# Patient Record
Sex: Female | Born: 1940 | Race: White | Hispanic: No | Marital: Single | Smoking: Never smoker
Health system: Southern US, Community
[De-identification: ages and names within clinical notes are randomized; demographics above are authoritative.]

## PROBLEM LIST (undated history)

## (undated) DIAGNOSIS — F329 Major depressive disorder, single episode, unspecified: Secondary | ICD-10-CM

## (undated) DIAGNOSIS — F32A Depression, unspecified: Secondary | ICD-10-CM

## (undated) HISTORY — PX: TUBAL LIGATION: SHX77

## (undated) HISTORY — PX: CHOLECYSTECTOMY: SHX55

---

## 2017-02-01 ENCOUNTER — Other Ambulatory Visit
Admission: RE | Admit: 2017-02-01 | Discharge: 2017-02-01 | Disposition: A | Discharge status: Home / Self Care | Payer: Non-veteran care | Source: Ambulatory Visit | Attending: Gastroenterology | Admitting: Gastroenterology

## 2017-02-01 ENCOUNTER — Encounter (INDEPENDENT_AMBULATORY_CARE_PROVIDER_SITE_OTHER): Payer: Self-pay

## 2017-02-01 ENCOUNTER — Encounter: Payer: Self-pay | Admitting: Gastroenterology

## 2017-02-01 ENCOUNTER — Ambulatory Visit (INDEPENDENT_AMBULATORY_CARE_PROVIDER_SITE_OTHER): Payer: No Typology Code available for payment source | Admitting: Gastroenterology

## 2017-02-01 ENCOUNTER — Other Ambulatory Visit: Payer: Self-pay

## 2017-02-01 VITALS — BP 135/77 | HR 87 | Temp 98.2°F | Ht 65.0 in | Wt 120.8 lb

## 2017-02-01 DIAGNOSIS — R634 Abnormal weight loss: Secondary | ICD-10-CM | POA: Diagnosis not present

## 2017-02-01 DIAGNOSIS — R194 Change in bowel habit: Secondary | ICD-10-CM | POA: Diagnosis not present

## 2017-02-01 DIAGNOSIS — R1010 Upper abdominal pain, unspecified: Secondary | ICD-10-CM

## 2017-02-01 DIAGNOSIS — R1084 Generalized abdominal pain: Secondary | ICD-10-CM

## 2017-02-01 DIAGNOSIS — R109 Unspecified abdominal pain: Secondary | ICD-10-CM | POA: Insufficient documentation

## 2017-02-01 LAB — COMPREHENSIVE METABOLIC PANEL
ALT: 21 U/L (ref 14–54)
AST: 28 U/L (ref 15–41)
Albumin: 4.6 g/dL (ref 3.5–5.0)
Alkaline Phosphatase: 52 U/L (ref 38–126)
Anion gap: 8 (ref 5–15)
BUN: 10 mg/dL (ref 6–20)
CHLORIDE: 102 mmol/L (ref 101–111)
CO2: 30 mmol/L (ref 22–32)
CREATININE: 0.86 mg/dL (ref 0.44–1.00)
Calcium: 9.7 mg/dL (ref 8.9–10.3)
Glucose, Bld: 109 mg/dL — ABNORMAL HIGH (ref 65–99)
Potassium: 4.4 mmol/L (ref 3.5–5.1)
Sodium: 140 mmol/L (ref 135–145)
TOTAL PROTEIN: 7.6 g/dL (ref 6.5–8.1)
Total Bilirubin: 0.4 mg/dL (ref 0.3–1.2)

## 2017-02-01 LAB — CBC WITH DIFFERENTIAL/PLATELET
Basophils Absolute: 0 10*3/uL (ref 0–0.1)
Basophils Relative: 0 %
EOS PCT: 1 %
Eosinophils Absolute: 0.1 10*3/uL (ref 0–0.7)
HCT: 37.9 % (ref 35.0–47.0)
Hemoglobin: 12.5 g/dL (ref 12.0–16.0)
LYMPHS ABS: 1.5 10*3/uL (ref 1.0–3.6)
LYMPHS PCT: 25 %
MCH: 30.8 pg (ref 26.0–34.0)
MCHC: 33 g/dL (ref 32.0–36.0)
MCV: 93.3 fL (ref 80.0–100.0)
MONO ABS: 0.3 10*3/uL (ref 0.2–0.9)
Monocytes Relative: 5 %
Neutro Abs: 4.2 10*3/uL (ref 1.4–6.5)
Neutrophils Relative %: 69 %
PLATELETS: 276 10*3/uL (ref 150–440)
RBC: 4.06 MIL/uL (ref 3.80–5.20)
RDW: 13.2 % (ref 11.5–14.5)
WBC: 6.1 10*3/uL (ref 3.6–11.0)

## 2017-02-01 LAB — TSH: TSH: 0.864 u[IU]/mL (ref 0.350–4.500)

## 2017-02-01 MED ORDER — PEG 3350-KCL-NA BICARB-NACL 420 G PO SOLR: 4000.0000 mL | Freq: Once | ORAL | 0 refills | Status: AC

## 2017-02-01 MED ORDER — OMEPRAZOLE 40 MG PO CPDR: 40.0000 mg | DELAYED_RELEASE_CAPSULE | Freq: Every day | ORAL | 3 refills | Status: DC

## 2017-02-01 NOTE — Progress Notes (Signed)
Wyline MoodKiran Lizzie Cokley MD, MRCP(U.K) [address]  [address]  [place], [place] [address]  Main: [phone]  Fax: [phone]   Gastroenterology Consultation  Referring Provider:     [facility], [facility] Primary Care Physician:  [facility], [facility] Primary Gastroenterologist:  Dr. Wyline MoodKiran Dimitris Shanahan  Reason for Consultation:     ibs-mixed         HPI:   Jasmine BarreSandra Lee Isenhower is a 76 y.o. y/o female referred for consultation & management  by Dr. Eli Phillipsenter, [facility].    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    She says that in August she went to a picnic , came home ,was violently sick, thought she had food poisoning. Stopped throwing up and the diarrhea resolved. She subsequently developed constipation followed by diarrhea. She says she has some pain all over her abdomen all day long , worse after she eats, no better after a bowel movement , describes the pain as a squeezing in nature. Eating makes it worse. Made better by "nothing ".   She takes ibuprofen but stopped in august. She has a bowel movement twice a day , soft , consistency of pudding, she did have gas when she was taking probiotics. Since she stopped has no gas, She consumes stevia 3 teaspoons a day - no change in recently . Consumes 8 oz of diet soda a day . Occasionally sweetened tea once a week , no issues with gas in general. No blood in stool. She has lost 5 lbs since august . Never smoked. Her aunt had colon cancer. Her last colonoscopy 1 year back at the [facility] and was normal , last EGD 1 year back which she recalls was normal too.   Her biggest issues at this time is change in bowel habits and abdominal discomfort.   Not on any PPI.  No past medical history on file.    Prior to Admission medications   Not on File    No family history on file.   Social History   Tobacco Use  . Smoking status: Not on file  Substance Use Topics  . Alcohol use: Not on file  . Drug use: Not on file    Allergies  as of 02/01/2017  . (Not on File)    Review of Systems:    All systems reviewed and negative except where noted in HPI.   Physical Exam:  There were no vitals taken for this visit. No LMP recorded. Psych:  Alert and cooperative. Normal mood and affect. General:   Alert,  Well-developed,thim pleasant and cooperative in NAD Head:  Normocephalic and atraumatic. Eyes:  Sclera clear, no icterus.   Conjunctiva pink. Ears:  Normal auditory acuity. Nose:  No deformity, discharge, or lesions. Mouth:  No deformity or lesions,oropharynx pink & moist. Neck:  Supple; no masses or thyromegaly. Lungs:  Respirations even and unlabored.  Clear throughout to auscultation.   No wheezes, crackles, or rhonchi. No acute distress. Heart:  Regular rate and rhythm; no murmurs, clicks, rubs, or gallops. Abdomen:  Normal bowel sounds.  No bruits.  Soft,mild epigastric tenderness and non-distended without masses, hepatosplenomegaly or hernias noted.  No guarding or rebound tenderness.    Msk:  Symmetrical without gross deformities. Good, equal movement & strength bilaterally. Pulses:  Normal pulses noted. Extremities:  No clubbing or edema.  No cyanosis.tenderness in lower left sacral paraspinal muscles.  Neurologic:  Alert and oriented x3;  grossly normal neurologically. Skin:  Intact without significant lesions or rashes. No jaundice. Lymph Nodes:  No significant cervical adenopathy. Psych:  Alert and cooperative. Normal mood and affect.  Imaging Studies: No results found.  Assessment and Plan:   Jasmine Ibarra is a 76 y.o. y/o female has been referred for change in bowel habits, new onset abdominal pain , weight loss. She feels she is in significant discomfort, her symptoms do not suggest IBS as she has no relief of pain after a bowel movement. At her age , it would be important to r/o neoplasia. Other differentials would be a visceral hypersenitivity syndrome after a viral illness.    Plan  1. Stool  H pylori antigen ,CBC,CMP,CRP,TSH,celiac serology.  2. Ct abdomen and pelvis 3. EGD+colonoscopy to evaluate abdominal pain and change in bowel habits 4. Prilosec 40 mg once a day  5. LOW FODMAP diet 6. F/u with PCP for back pain.   7. Advised to stop all artificial sweeteners including stevia.   I have discussed alternative options, risks & benefits,  which include, but are not limited to, bleeding, infection, perforation,respiratory complication & drug reaction.  The patient agrees with this plan & written consent will be obtained.    Follow up in 2-3 weeks  Dr Wyline MoodKiran Kinslie Hove MD,MRCP(U.K)

## 2017-02-02 ENCOUNTER — Telehealth: Payer: Self-pay

## 2017-02-02 LAB — C-REACTIVE PROTEIN

## 2017-02-02 NOTE — Telephone Encounter (Signed)
Advised patient of CT Abd/Pelvis.  Dec 31st @ 800am. NPO 4 hours  Advised patient to pick-up contrast when she takes stool sample back to the lab.

## 2017-02-03 ENCOUNTER — Telehealth: Payer: Self-pay | Admitting: Gastroenterology

## 2017-02-03 LAB — CELIAC DISEASE PANEL
ENDOMYSIAL ANTIBODY IGA: NEGATIVE
IgA: 113 mg/dL (ref 64–422)

## 2017-02-03 NOTE — Telephone Encounter (Signed)
Patient left a voice message that she has questions regarding her procedure. Please call

## 2017-02-03 NOTE — Telephone Encounter (Signed)
Patient has VA insurance and at this time has 2 visits authorized. She stated she has a CT scan appt on 12/31. Did someone send for more authorization?   Please call patient and let her know what is going on

## 2017-02-04 ENCOUNTER — Ambulatory Visit: Payer: Non-veteran care | Admitting: Anesthesiology

## 2017-02-04 ENCOUNTER — Encounter
Admission: RE | Disposition: A | Discharge status: Home / Self Care | Payer: Self-pay | Source: Ambulatory Visit | Attending: Gastroenterology

## 2017-02-04 ENCOUNTER — Ambulatory Visit
Admission: RE | Admit: 2017-02-04 | Discharge: 2017-02-04 | Disposition: A | Discharge status: Home / Self Care | Payer: Non-veteran care | Source: Ambulatory Visit | Attending: Gastroenterology | Admitting: Gastroenterology

## 2017-02-04 ENCOUNTER — Encounter: Payer: Self-pay | Admitting: *Deleted

## 2017-02-04 DIAGNOSIS — K219 Gastro-esophageal reflux disease without esophagitis: Secondary | ICD-10-CM | POA: Diagnosis not present

## 2017-02-04 DIAGNOSIS — F329 Major depressive disorder, single episode, unspecified: Secondary | ICD-10-CM | POA: Diagnosis not present

## 2017-02-04 DIAGNOSIS — K2970 Gastritis, unspecified, without bleeding: Secondary | ICD-10-CM | POA: Diagnosis not present

## 2017-02-04 DIAGNOSIS — E039 Hypothyroidism, unspecified: Secondary | ICD-10-CM | POA: Insufficient documentation

## 2017-02-04 DIAGNOSIS — R109 Unspecified abdominal pain: Secondary | ICD-10-CM | POA: Diagnosis not present

## 2017-02-04 DIAGNOSIS — Z888 Allergy status to other drugs, medicaments and biological substances status: Secondary | ICD-10-CM | POA: Diagnosis not present

## 2017-02-04 DIAGNOSIS — Z800 Family history of malignant neoplasm of digestive organs: Secondary | ICD-10-CM | POA: Insufficient documentation

## 2017-02-04 DIAGNOSIS — R1084 Generalized abdominal pain: Secondary | ICD-10-CM | POA: Insufficient documentation

## 2017-02-04 DIAGNOSIS — Z79899 Other long term (current) drug therapy: Secondary | ICD-10-CM | POA: Diagnosis not present

## 2017-02-04 DIAGNOSIS — R1010 Upper abdominal pain, unspecified: Secondary | ICD-10-CM

## 2017-02-04 DIAGNOSIS — Z882 Allergy status to sulfonamides status: Secondary | ICD-10-CM | POA: Diagnosis not present

## 2017-02-04 DIAGNOSIS — R194 Change in bowel habit: Secondary | ICD-10-CM

## 2017-02-04 DIAGNOSIS — R634 Abnormal weight loss: Secondary | ICD-10-CM

## 2017-02-04 HISTORY — PX: COLONOSCOPY WITH PROPOFOL: SHX5780

## 2017-02-04 HISTORY — DX: Depression, unspecified: F32.A

## 2017-02-04 HISTORY — DX: Major depressive disorder, single episode, unspecified: F32.9

## 2017-02-04 HISTORY — PX: ESOPHAGOGASTRODUODENOSCOPY (EGD) WITH PROPOFOL: SHX5813

## 2017-02-04 LAB — H. PYLORI ANTIGEN, STOOL: H. Pylori Stool Ag, Eia: NEGATIVE

## 2017-02-04 SURGERY — ESOPHAGOGASTRODUODENOSCOPY (EGD) WITH PROPOFOL
Anesthesia: General

## 2017-02-04 MED ORDER — PROPOFOL 10 MG/ML IV BOLUS
INTRAVENOUS | Status: DC | PRN
Start: 2017-02-04 — End: 2017-02-04
  Administered 2017-02-04: 20 mg via INTRAVENOUS
  Administered 2017-02-04: 50 mg via INTRAVENOUS
  Administered 2017-02-04: 30 mg via INTRAVENOUS

## 2017-02-04 MED ORDER — FENTANYL CITRATE (PF) 100 MCG/2ML IJ SOLN: 25.0000 ug | INTRAMUSCULAR | Status: DC | PRN

## 2017-02-04 MED ORDER — SODIUM CHLORIDE 0.9 % IV SOLN
INTRAVENOUS | Status: DC
  Administered 2017-02-04: 09:00:00 via INTRAVENOUS

## 2017-02-04 MED ORDER — PROPOFOL 500 MG/50ML IV EMUL
INTRAVENOUS | Status: DC | PRN
  Administered 2017-02-04: 150 ug/kg/min via INTRAVENOUS

## 2017-02-04 MED ORDER — PROPOFOL 500 MG/50ML IV EMUL
INTRAVENOUS | Status: AC
  Filled 2017-02-04: qty 50

## 2017-02-04 MED ORDER — ONDANSETRON HCL 4 MG/2ML IJ SOLN
4.0000 mg | Freq: Once | INTRAMUSCULAR | Status: DC | PRN
Start: 2017-02-04 — End: 2017-02-04

## 2017-02-04 NOTE — H&P (Signed)
Jasmine MoodKiran Daya Dutt, MD [address], [address], [place], [place], [address] [address], [address], [place], [place], [address] Phone: [phone]  Fax: [phone]  Primary Care Physician:  [facility]   Pre-Procedure History & Physical: HPI:  Jasmine Ibarra is a 76 y.o. female is here for an endoscopy and colonoscopy    Past Medical History:  Diagnosis Date  . Depression     Past Surgical History:  Procedure Laterality Date  . CHOLECYSTECTOMY    . TUBAL LIGATION      Prior to Admission medications   Medication Sig Start Date End Date Taking? Authorizing Provider  buPROPion (WELLBUTRIN) 100 MG tablet Take 100 mg by mouth.   Yes [provider]  Cholecalciferol (D3-1000 PO) Take 1 tablet by mouth.   Yes [provider]  levothyroxine (SYNTHROID, LEVOTHROID) 100 MCG tablet Take by mouth.   Yes [provider]  MELATONIN ER PO Take 1 tablet by mouth.   Yes [provider]  Multiple Vitamin (MULTIVITAMIN) tablet Take 1 tablet by mouth daily.   Yes [provider]  omeprazole (PRILOSEC) 40 MG capsule Take 1 capsule (40 mg total) by mouth daily. 02/01/17  Yes Jasmine MoodAnna, Virgle Arth, MD  traZODone (DESYREL) 100 MG tablet Take 100 mg by mouth.   Yes [provider]  estradiol (ESTRACE) 0.1 MG/GM vaginal cream Place vaginally.    [provider]    Allergies as of 02/01/2017 - Review Complete 02/01/2017  Allergen Reaction Noted  . Atorvastatin  04/29/2016  . Sulfamethoxazole-trimethoprim  04/29/2016    Family History  Problem Relation Age of Onset  . Alzheimer's disease Father   . Colon cancer Maternal Aunt     Social History   Socioeconomic History  . Marital status: Unknown    Spouse name: Not on file  . Number of children: Not on file  . Years of education: Not on file  . Highest education level: Not on file  Social Needs  . Financial resource strain: Not on file  . Food insecurity - worry: Not on  file  . Food insecurity - inability: Not on file  . Transportation needs - medical: Not on file  . Transportation needs - non-medical: Not on file  Occupational History  . Not on file  Tobacco Use  . Smoking status: Never Smoker  . Smokeless tobacco: Never Used  Substance and Sexual Activity  . Alcohol use: No    Frequency: Never  . Drug use: No  . Sexual activity: Yes  Other Topics Concern  . Not on file  Social History Narrative  . Not on file    Review of Systems: See HPI, otherwise negative ROS  Physical Exam: BP 134/78   Pulse 90   Temp (!) 97.2 F (36.2 C) (Tympanic)   Resp 18   Ht 5\' 4"  (1.626 m)   Wt 120 lb (54.4 kg)   SpO2 99%   BMI 20.60 kg/m  General:   Alert,  pleasant and cooperative in NAD Head:  Normocephalic and atraumatic. Neck:  Supple; no masses or thyromegaly. Lungs:  Clear throughout to auscultation, normal respiratory effort.    Heart:  +S1, +S2, Regular rate and rhythm, No edema. Abdomen:  Soft, nontender and nondistended. Normal bowel sounds, without guarding, and without rebound.   Neurologic:  Alert and  oriented x4;  grossly normal neurologically.  Impression/Plan: Jasmine Ibarra Jasmine Ibarra is here for an endoscopy and colonoscopy  to be performed for  evaluation of abdominal pain and change in bowel habits    Risks, benefits, limitations, and alternatives regarding endoscopy have been reviewed with the patient.  Questions have been answered.  All parties agreeable.   Jasmine MoodKiran Lindaann Gradilla, MD  02/04/2017, 9:05 AM

## 2017-02-04 NOTE — Anesthesia Postprocedure Evaluation (Signed)
Anesthesia Post Note  Patient: Jasmine Ibarra  Procedure(s) Performed: ESOPHAGOGASTRODUODENOSCOPY (EGD) WITH PROPOFOL (N/A ) COLONOSCOPY WITH PROPOFOL (N/A )  Patient location during evaluation: PACU Anesthesia Type: General Level of consciousness: awake and alert and oriented Pain management: pain level controlled Vital Signs Assessment: post-procedure vital signs reviewed and stable Respiratory status: spontaneous breathing Cardiovascular status: blood pressure returned to baseline Anesthetic complications: no     Last Vitals:  Vitals:   02/04/17 1047 02/04/17 1057  BP: 113/60 115/70  Pulse: 68 74  Resp: (!) 21 19  Temp:    SpO2: 100% 100%    Last Pain:  Vitals:   02/04/17 1027  TempSrc:   PainSc: 8                  Synia Douglass

## 2017-02-04 NOTE — Anesthesia Procedure Notes (Signed)
Date/Time: 02/04/2017 9:50 AM Performed by: Junious SilkNoles, Antonina Deziel, CRNA Pre-anesthesia Checklist: Patient identified, Emergency Drugs available, Suction available, Patient being monitored and Timeout performed Oxygen Delivery Method: Nasal cannula

## 2017-02-04 NOTE — Progress Notes (Signed)
Pt requested to signed discharge. Pt alert,verbal. Did not want friend to come to recovery area.

## 2017-02-04 NOTE — Transfer of Care (Signed)
Immediate Anesthesia Transfer of Care Note  Patient: Jasmine PeekSandra L Denker  Procedure(s) Performed: ESOPHAGOGASTRODUODENOSCOPY (EGD) WITH PROPOFOL (N/A ) COLONOSCOPY WITH PROPOFOL (N/A )  Patient Location: PACU  Anesthesia Type:General  Level of Consciousness: sedated  Airway & Oxygen Therapy: Patient Spontanous Breathing and Patient connected to nasal cannula oxygen  Post-op Assessment: Report given to RN and Post -op Vital signs reviewed and stable  Post vital signs: Reviewed and stable  Last Vitals:  Vitals:   02/04/17 0837 02/04/17 1017  BP: 134/78   Pulse: 90   Resp: 18   Temp: (!) 36.2 C (!) 35.9 C  SpO2: 99%     Last Pain:  Vitals:   02/04/17 0837  TempSrc: Tympanic         Complications: No apparent anesthesia complications

## 2017-02-04 NOTE — Anesthesia Preprocedure Evaluation (Addendum)
Anesthesia Evaluation  Patient identified by MRN, date of birth, ID band Patient awake    Reviewed: Allergy & Precautions, NPO status , Patient's Chart, lab work & pertinent test results  Airway Mallampati: II  TM Distance: >3 FB     Dental  (+) Teeth Intact   Pulmonary neg pulmonary ROS,    Pulmonary exam normal        Cardiovascular negative cardio ROS Normal cardiovascular exam     Neuro/Psych PSYCHIATRIC DISORDERS Depression negative neurological ROS     GI/Hepatic GERD  Medicated and Controlled,  Endo/Other  Hypothyroidism   Renal/GU   negative genitourinary   Musculoskeletal   Abdominal Normal abdominal exam  (+)   Peds negative pediatric ROS (+)  Hematology   Anesthesia Other Findings   Reproductive/Obstetrics                             Anesthesia Physical Anesthesia Plan  ASA: II  Anesthesia Plan: General   Post-op Pain Management:    Induction:   PONV Risk Score and Plan:   Airway Management Planned: Nasal Cannula  Additional Equipment:   Intra-op Plan:   Post-operative Plan:   Informed Consent: I have reviewed the patients History and Physical, chart, labs and discussed the procedure including the risks, benefits and alternatives for the proposed anesthesia with the patient or authorized representative who has indicated his/her understanding and acceptance.   Dental advisory given  Plan Discussed with: CRNA and Surgeon  Anesthesia Plan Comments:         Anesthesia Quick Evaluation

## 2017-02-04 NOTE — Op Note (Signed)
[facility] Gastroenterology Patient Name: Domingo MendSandra Bal Procedure Date: 02/04/2017 9:43 AM MRN: [id number] Account #: [id number] Date of Birth: Feb 08, 1941 Admit Type: Outpatient Age: [age] [facility]OM 4 Gender: Female Note Status: Finalized Procedure:            Upper GI endoscopy Indications:          Dyspepsia Providers:            Wyline MoodKiran Zell Hylton MD, MD Referring MD:         [facility], [facility] (Referring MD) Medicines:            Monitored Anesthesia Care Complications:        No immediate complications. Procedure:            Pre-Anesthesia Assessment:                       - Prior to the procedure, a History and Physical was                        performed, and patient medications, allergies and                        sensitivities were reviewed. The patient's tolerance of                        previous anesthesia was reviewed.                       - The risks and benefits of the procedure and the                        sedation options and risks were discussed with the                        patient. All questions were answered and informed                        consent was obtained.                       - ASA Grade Assessment: II - A patient with mild                        systemic disease.                       After obtaining informed consent, the endoscope was                        passed under direct vision. Throughout the procedure,                        the patient's blood pressure, pulse, and oxygen                        saturations were monitored continuously. The Endoscope                        was introduced through the mouth, and advanced to the  third part of duodenum. The upper GI endoscopy was                        accomplished with ease. The patient tolerated the                        procedure well. Findings:      The examined duodenum was normal.      The esophagus was normal.      Patchy moderate  inflammation characterized by adherent blood, congestion       (edema), erosions and erythema was found in the gastric body and in the       gastric antrum. Biopsies were taken with a cold forceps for histology.      The cardia and gastric fundus were normal on retroflexion. Impression:           - Normal examined duodenum.                       - Normal esophagus.                       - Gastritis. Biopsied. Recommendation:       - Await pathology results.                       - Perform a colonoscopy today. Procedure Code(s):    --- Professional ---                       [phone], Esophagogastroduodenoscopy, flexible, transoral;                        with biopsy, single or multiple Diagnosis Code(s):    --- Professional ---                       K29.70, Gastritis, unspecified, without bleeding                       R10.13, Epigastric pain CPT copyright 2016 American Medical Association. All rights reserved. The codes documented in this report are preliminary and upon coder review may  be revised to meet current compliance requirements. Wyline MoodKiran Davidlee Jeanbaptiste, MD Wyline MoodKiran Livi Mcgann MD, MD 02/04/2017 9:52:27 AM This report has been signed electronically. Number of Addenda: 0 Note Initiated On: 02/04/2017 9:43 AM      [facility]

## 2017-02-04 NOTE — Anesthesia Post-op Follow-up Note (Signed)
Anesthesia QCDR form completed.        

## 2017-02-04 NOTE — Op Note (Signed)
[facility] Gastroenterology Patient Name: Jasmine Ibarra Procedure Date: 02/04/2017 9:26 AM MRN: [id number] Account #: [id number] Date of Birth: Jul 13, 1940 Admit Type: Outpatient Age: 76 [facility]OM 4 Gender: Female Note Status: Finalized Procedure:            Colonoscopy Indications:          Generalized abdominal pain Providers:            Wyline MoodKiran Babette Stum MD, MD Referring MD:         [facility], [facility] (Referring MD) Medicines:            Monitored Anesthesia Care Complications:        No immediate complications. Procedure:            Pre-Anesthesia Assessment:                       - Prior to the procedure, a History and Physical was                        performed, and patient medications, allergies and                        sensitivities were reviewed. The patient's tolerance of                        previous anesthesia was reviewed.                       - The risks and benefits of the procedure and the                        sedation options and risks were discussed with the                        patient. All questions were answered and informed                        consent was obtained.                       - ASA Grade Assessment: II - A patient with mild                        systemic disease.                       After obtaining informed consent, the colonoscope was                        passed under direct vision. Throughout the procedure,                        the patient's blood pressure, pulse, and oxygen                        saturations were monitored continuously. The                        Colonoscope was introduced through the anus and                        advanced  to the the cecum, identified by the                        appendiceal orifice, IC valve and transillumination.                        The colonoscopy was performed with ease. The patient                        tolerated the procedure well. The quality of the bowel                        preparation was adequate. Findings:      The perianal and digital rectal examinations were normal.      The exam was otherwise without abnormality on direct and retroflexion       views. Impression:           - The examination was otherwise normal on direct and                        retroflexion views.                       - No specimens collected. Recommendation:       - Discharge patient to home (with escort).                       - Resume previous diet.                       - Continue present medications.                       - Return to my office in 6 weeks. Procedure Code(s):    --- Professional ---                       [phone], Colonoscopy, flexible; diagnostic, including                        collection of specimen(s) by brushing or washing, when                        performed (separate procedure) Diagnosis Code(s):    --- Professional ---                       R10.84, Generalized abdominal pain CPT copyright 2016 American Medical Association. All rights reserved. The codes documented in this report are preliminary and upon coder review may  be revised to meet current compliance requirements. Wyline MoodKiran Mahaila Tischer, MD Wyline MoodKiran Dezi Schaner MD, MD 02/04/2017 10:12:52 AM This report has been signed electronically. Number of Addenda: 0 Note Initiated On: 02/04/2017 9:26 AM Scope Withdrawal Time: 0 hours 11 minutes 22 seconds  Total Procedure Duration: 0 hours 15 minutes 42 seconds       [facility]

## 2017-02-09 ENCOUNTER — Telehealth: Payer: Self-pay

## 2017-02-09 ENCOUNTER — Encounter: Payer: Self-pay | Admitting: Gastroenterology

## 2017-02-09 LAB — SURGICAL PATHOLOGY

## 2017-02-09 NOTE — Telephone Encounter (Signed)
Pt has been informed labs are all normal.

## 2017-02-10 ENCOUNTER — Telehealth: Payer: Self-pay | Admitting: Gastroenterology

## 2017-02-10 NOTE — Telephone Encounter (Signed)
Patient needs to know if the [facility] has [facility] for Monday. Please call her today because she will be gone tomorrow.

## 2017-02-11 NOTE — Telephone Encounter (Signed)
[facility] Waiting on call back for approval.

## 2017-02-14 ENCOUNTER — Ambulatory Visit: Payer: No Typology Code available for payment source

## 2017-02-16 ENCOUNTER — Encounter: Payer: Self-pay | Admitting: Gastroenterology

## 2017-02-17 ENCOUNTER — Ambulatory Visit
Admission: RE | Admit: 2017-02-17 | Discharge: 2017-02-17 | Disposition: A | Discharge status: Home / Self Care | Payer: Non-veteran care | Source: Ambulatory Visit | Attending: Gastroenterology | Admitting: Gastroenterology

## 2017-02-17 DIAGNOSIS — R1084 Generalized abdominal pain: Secondary | ICD-10-CM | POA: Insufficient documentation

## 2017-02-17 DIAGNOSIS — I700 Atherosclerosis of aorta: Secondary | ICD-10-CM | POA: Insufficient documentation

## 2017-02-17 MED ORDER — IOPAMIDOL (ISOVUE-300) INJECTION 61%
100.0000 mL | Freq: Once | INTRAVENOUS | Status: AC | PRN
  Administered 2017-02-17: 100 mL via INTRAVENOUS

## 2017-02-18 ENCOUNTER — Telehealth: Payer: Self-pay

## 2017-02-18 MED ORDER — OMEPRAZOLE 40 MG PO CPDR: 40.0000 mg | DELAYED_RELEASE_CAPSULE | Freq: Every day | ORAL | 3 refills | Status: DC

## 2017-02-18 NOTE — Telephone Encounter (Signed)
Advised patient of results per Dr. Tobi BastosAnna.      Shows mild thickening of the descending and sigmoid colon . Likely from underdistension as mentioned by the radiologist . Recent Colonoscopy 01/2017 showed no abnormalities     Patient spoke to Dr. Tobi BastosAnna directly on the phone for additional clarity and questions.   Omeprazole 40mg  sent to pharmacy.

## 2017-03-10 ENCOUNTER — Telehealth: Payer: Self-pay | Admitting: Gastroenterology

## 2017-03-10 NOTE — Telephone Encounter (Signed)
Patient called and Pantaprozole 40 is making her sick and diarrhea also body aches. What else can she take?

## 2017-03-11 ENCOUNTER — Telehealth: Payer: Self-pay

## 2017-03-11 NOTE — Telephone Encounter (Signed)
Patient states pantoprazole Rx makes her feel ill. Advised patient to stop taking Pantoprazole and a Rx for omeprazole could be sent to the pharmacy of her choice.   Patient [facility], Dr. Sula RumpleElspeth Clark @ [phone]). Left message with secretary for Omeprazole 40mg  Rx. Also left contact information for any questions.

## 2017-03-29 ENCOUNTER — Ambulatory Visit (INDEPENDENT_AMBULATORY_CARE_PROVIDER_SITE_OTHER): Payer: No Typology Code available for payment source | Admitting: Gastroenterology

## 2017-03-29 ENCOUNTER — Other Ambulatory Visit: Payer: Self-pay

## 2017-03-29 ENCOUNTER — Encounter: Payer: Self-pay | Admitting: Gastroenterology

## 2017-03-29 ENCOUNTER — Encounter (INDEPENDENT_AMBULATORY_CARE_PROVIDER_SITE_OTHER): Payer: Self-pay

## 2017-03-29 VITALS — BP 145/74 | HR 85 | Temp 98.1°F | Ht 65.0 in | Wt 118.4 lb

## 2017-03-29 DIAGNOSIS — K30 Functional dyspepsia: Secondary | ICD-10-CM | POA: Diagnosis not present

## 2017-03-29 MED ORDER — OMEPRAZOLE 40 MG PO CPDR: 40.0000 mg | DELAYED_RELEASE_CAPSULE | Freq: Every day | ORAL | 3 refills | Status: DC

## 2017-03-29 MED ORDER — OMEPRAZOLE 40 MG PO CPDR: 40.0000 mg | DELAYED_RELEASE_CAPSULE | Freq: Every day | ORAL | 3 refills | Status: AC

## 2017-03-29 NOTE — Progress Notes (Addendum)
[name] MD, MRCP(U.K) [address]  [address]  [place], [place] [address]  Main: [phone]  Fax: [phone]   Primary Care Physician: [facility]  Primary Gastroenterologist:  Dr. [name]   Chief Complaint  Patient presents with  . IBS  . Gastritis    HPI: Jasmine Ibarra is a 77 y.o. 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    She says that in August she went to a picnic , came home ,was violently sick, thought she had food poisoning. Stopped throwing up and the diarrhea resolved. She subsequently developed constipation followed by diarrhea. She says she has some pain all over her abdomen all day long , worse after she eats, no better after a bowel movement , describes the pain as a squeezing in nature. Eating makes it worse. Made better by "nothing ".  Her aunt had colon cancer. Her last colonoscopy 1 year back at the [place] and was normal , last EGD 1 year back which she recalls was normal too.    Interval history   02/01/2017-  03/29/2016  02/02/17- Stool H pylori antigen ,CBC,CMP,CRP,TSH,celiac serology-normal  02/04/17- egd- GASTRITIS, confirmed on bx, colonoscopy-normal   CT abdomen nil acute in 02/2017 . Was commenced on Protonix which made her sick and had some diarrhea. She called office, was changed to omeprazole, last 10 days seems to have helped.   At this time her main symptoms has 2 episodes of diarrhea a day , feels tired. Some non specific abdominal discomfort. No gas or bloating . Stool is consistency of pudding. Trying LOWFODMAP diet last 2 weeks and since still not noticed much difference. Weight is stable. All began in august .     Current Outpatient Medications  Medication Sig Dispense Refill  . buPROPion (WELLBUTRIN) 100 MG tablet Take 100 mg by mouth.    . Cholecalciferol (D3-1000 PO) Take 1 tablet by mouth.    . estradiol (ESTRACE)  0.1 MG/GM vaginal cream Place vaginally.    Marland Kitchen levothyroxine (SYNTHROID, LEVOTHROID) 100 MCG tablet Take by mouth.    . MELATONIN ER PO Take 1 tablet by mouth.    . Multiple Vitamin (MULTIVITAMIN) tablet Take 1 tablet by mouth daily.    Marland Kitchen omeprazole (PRILOSEC) 40 MG capsule Take 1 capsule (40 mg total) by mouth daily. 90 capsule 3  . traZODone (DESYREL) 100 MG tablet Take 100 mg by mouth.     No current facility-administered medications for this visit.     Allergies as of 03/29/2017 - Review Complete 03/29/2017  Allergen Reaction Noted  . Pantoprazole Diarrhea and Nausea Only 03/29/2017  . Atorvastatin  04/29/2016  . Sulfamethoxazole-trimethoprim  04/29/2016    ROS:  General: Negative for anorexia, weight loss, fever, chills, fatigue, weakness. ENT: Negative for hoarseness, difficulty swallowing , nasal congestion. CV: Negative for chest pain, angina, palpitations, dyspnea on exertion, peripheral edema.  Respiratory: Negative for dyspnea at rest, dyspnea on exertion, cough, sputum, wheezing.  GI: See history of present illness. GU:  Negative for dysuria, hematuria, urinary incontinence, urinary frequency, nocturnal urination.  Endo: Negative for unusual weight change.    Physical Examination:   BP (!) 145/74 (BP Location: Left Arm, Patient Position: Sitting, Cuff Size: Normal)   Pulse 85   Temp 98.1 F (36.7 C) (Oral)   Ht 5\' 5"  (1.651 m)   Wt 118 lb  6.4 oz (53.7 kg)   BMI 19.70 kg/m   General: Well-nourished, well-developed in no acute distress.  Eyes: No icterus. Conjunctivae pink. Mouth: Oropharyngeal mucosa moist and pink , no lesions erythema or exudate. Lungs: Clear to auscultation bilaterally. Non-labored. Heart: Regular rate and rhythm, no murmurs rubs or gallops.  Abdomen: Bowel sounds are normal, nontender, nondistended, no hepatosplenomegaly or masses, no abdominal bruits or hernia , no rebound or guarding.   Extremities: No lower extremity edema. No clubbing or  deformities. Neuro: Alert and oriented x 3.  Grossly intact. Skin: Warm and dry, no jaundice.   Psych: Alert and cooperative, normal mood and affect.   Imaging Studies: No results found.  Assessment and Plan:   Jasmine Ibarra is a 77 y.o. y/o female here to follow up for "softer stools", vague abdominal discomfort. EGD demosntrated gastritis- she is not on any NSAID's , some relief with omeprazole 20 mg, no frank diarrhea but rather mushy stool. 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 Omeprazole  20 mg once a day increase to 40 mg a day  5. LOW FODMAP diet 3. Fiber supplement - samples provided  4. IB guard- samples provided 5. If no better at next visit will try amitryptaline.   Dr Wyline MoodKiran Caiden Monsivais  MD,MRCP [facility](U.K) Follow up in 2 months

## 2017-05-30 ENCOUNTER — Ambulatory Visit: Payer: No Typology Code available for payment source | Admitting: Gastroenterology

## 2019-02-27 IMAGING — CT CT ABD-PELV W/ CM
2 of 5 series · 16 of 46 positions shown, 18 images · IV contrast (APPLIED)
Comparison: None.

CLINICAL DATA: 76-year-old female with generalized abdominal and
pelvic pain, nausea and diarrhea.

EXAM:
CT ABDOMEN AND PELVIS WITH CONTRAST
TECHNIQUE: Multidetector CT imaging of the abdomen and pelvis was performed
using the standard protocol following bolus administration of
intravenous contrast.
CONTRAST:  100mL 5XREUZ-AOO IOPAMIDOL (5XREUZ-AOO) INJECTION 61%

[Series 2: axial st · axial · 0.63mm/px · z∈[-478,-88]mm · 13 of 88 slices shown, 15 images]
[im 5/88  soft-tissue]
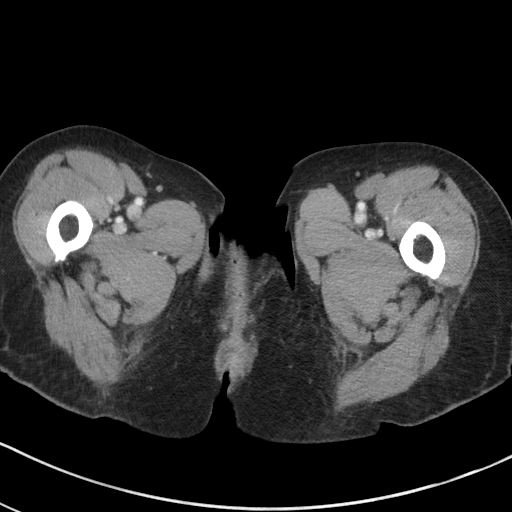
[im 5/88  bone]
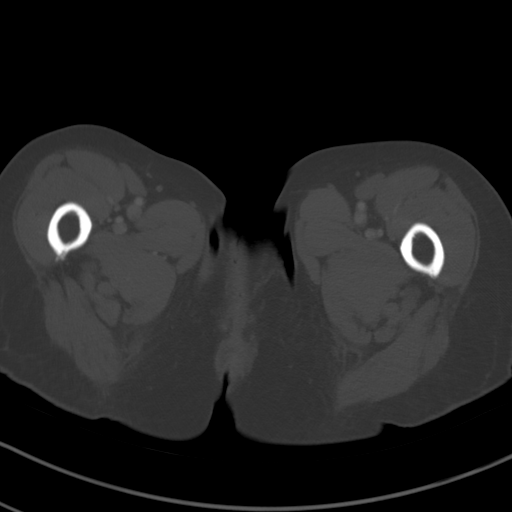
[im 14/88  soft-tissue]
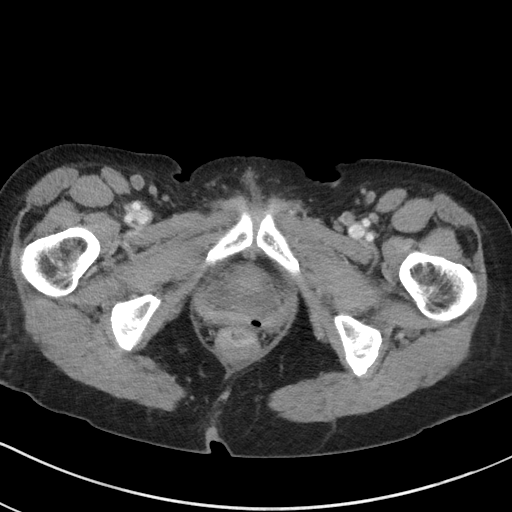
[im 18/88  soft-tissue]
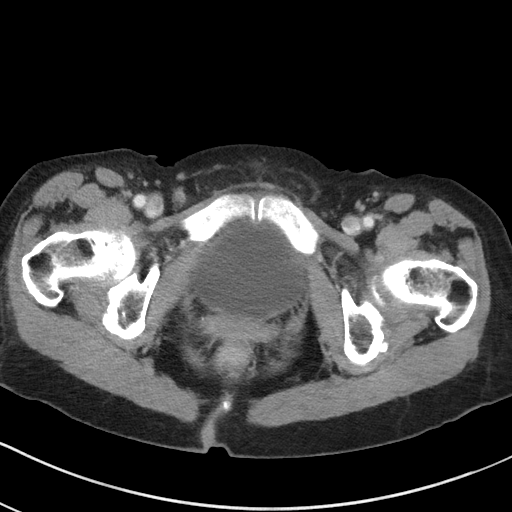
[im 27/88  soft-tissue]
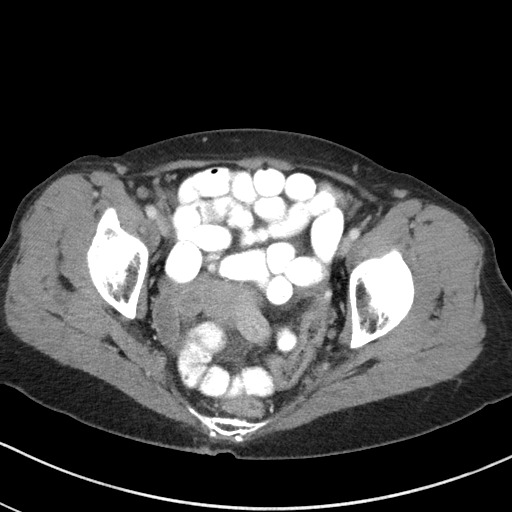
[im 31/88  soft-tissue]
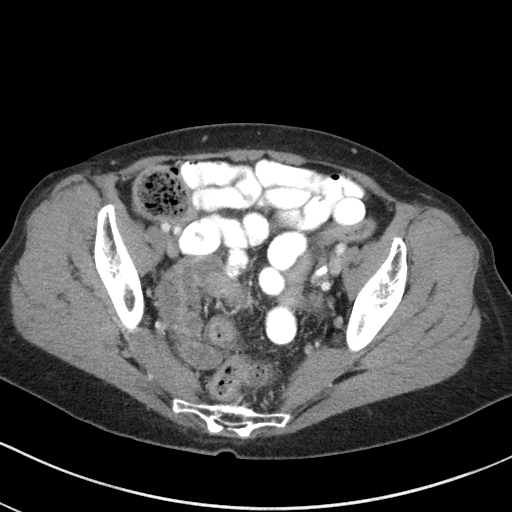
[im 40/88  soft-tissue]
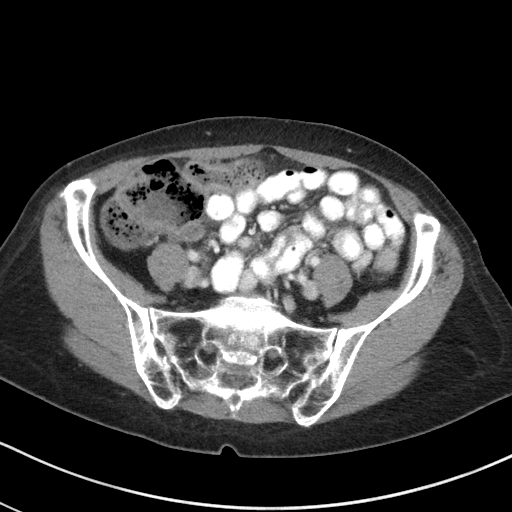
[im 44/88  soft-tissue]
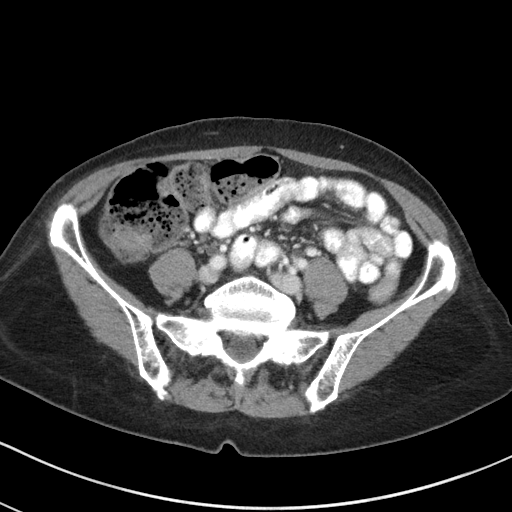
[im 48/88  soft-tissue]
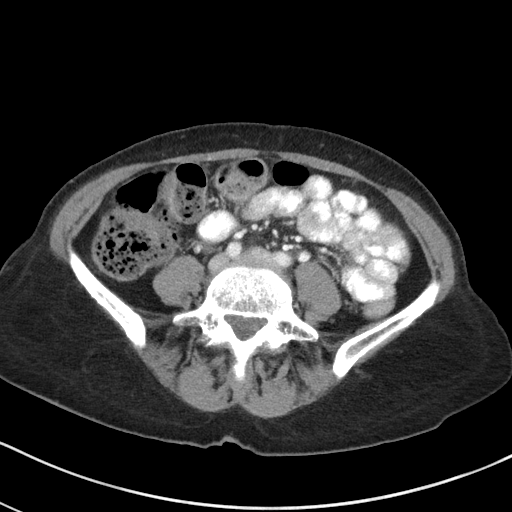
[im 57/88  soft-tissue]
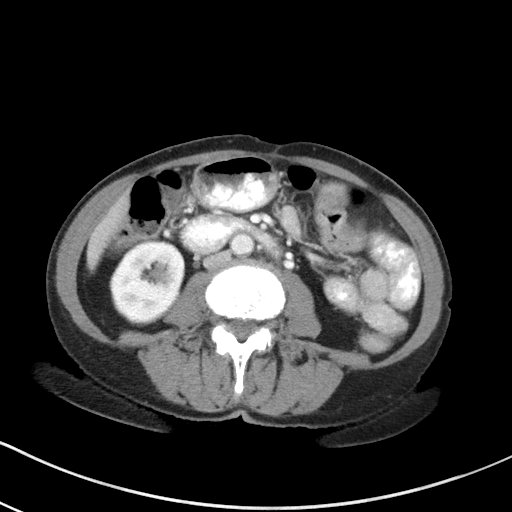
[im 57/88  bone]
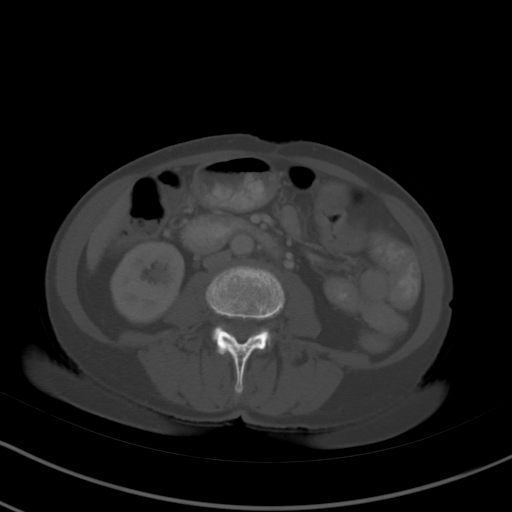
[im 61/88  soft-tissue]
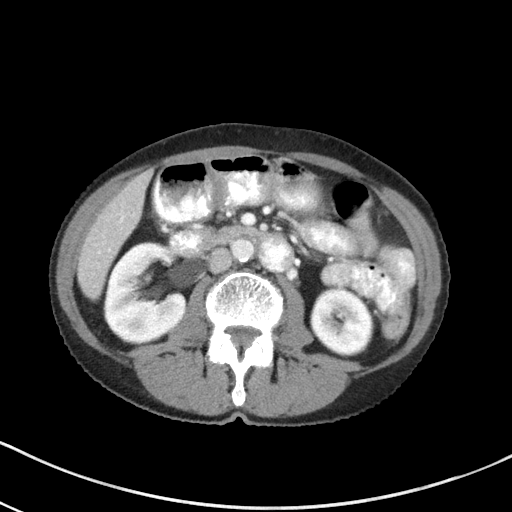
[im 70/88  soft-tissue]
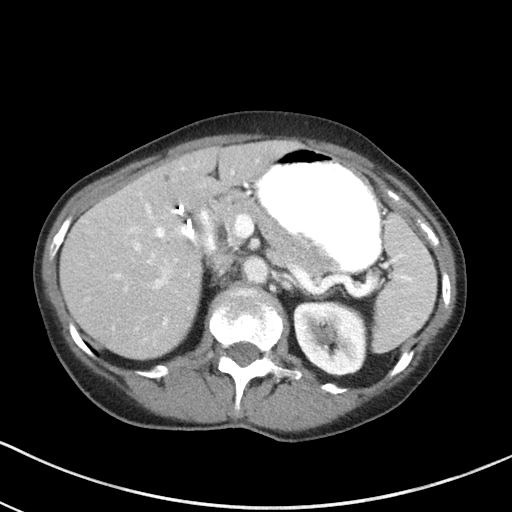
[im 74/88  soft-tissue]
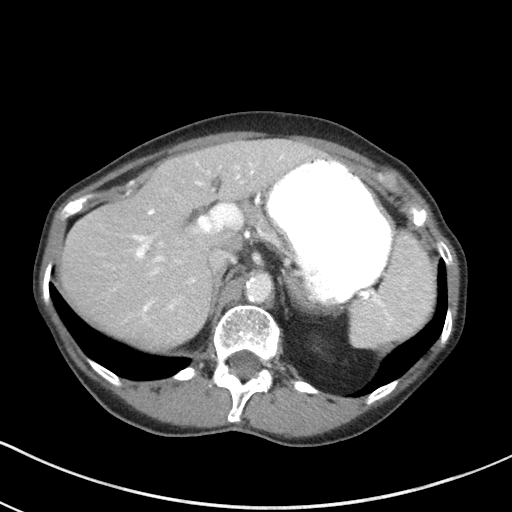
[im 83/88  soft-tissue]
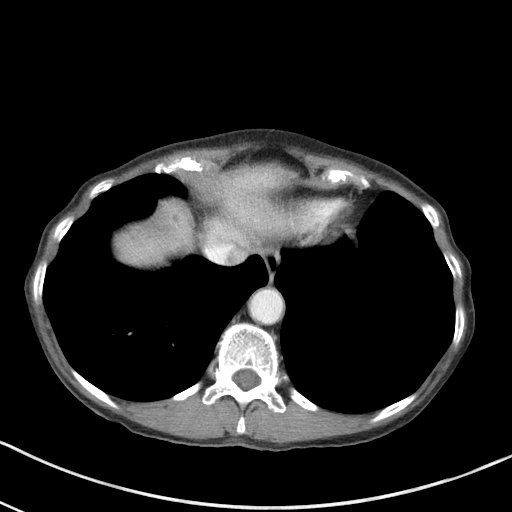

[Series 5: coronal st · coronal · 0.62mm/px · 3 of 72 slices shown]
[im 24/72  soft-tissue]
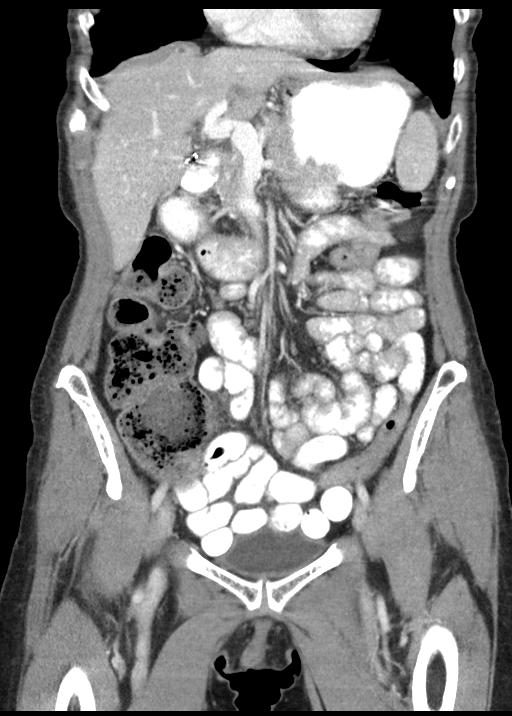
[im 32/72  soft-tissue]
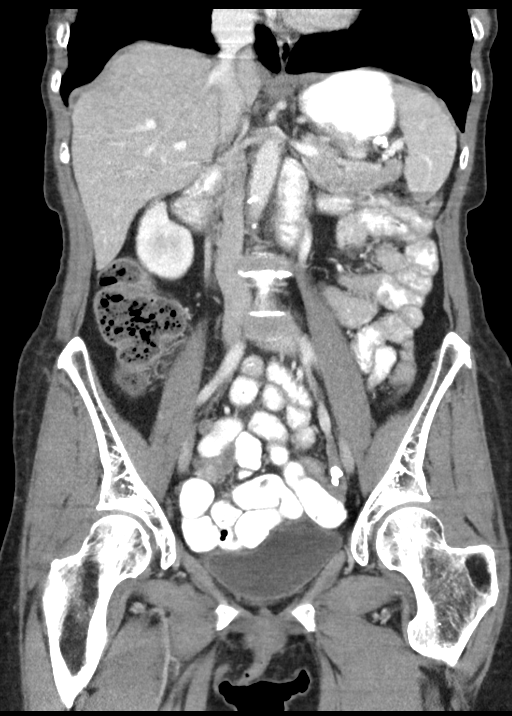
[im 40/72  soft-tissue]
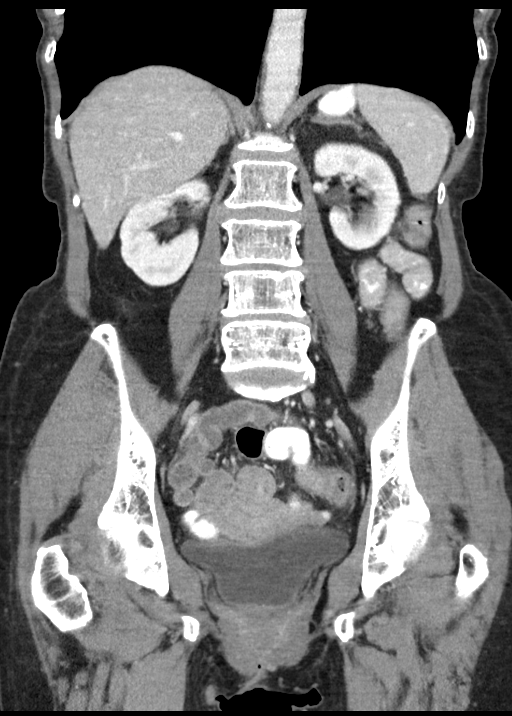

[16 of 46 positions shown; findings below may reference images not displayed]

FINDINGS: Lower chest: No acute abnormality.

Hepatobiliary: The liver is unremarkable. The patient is status post
cholecystectomy. No biliary dilatation.

Pancreas: Unremarkable

Spleen: Unremarkable

Adrenals/Urinary Tract: The kidneys, adrenal glands and bladder are
unremarkable.

Stomach/Bowel: There is equivocal mild circumferential wall
thickening of the descending and sigmoid colon with possible mild
adjacent stranding/inflammation. This may represent a mild colitis
or under distention of the colon.

No bowel obstruction or other bowel wall thickening identified.

Vascular/Lymphatic: Aortic atherosclerosis. No enlarged abdominal or
pelvic lymph nodes.

Reproductive: Uterus and bilateral adnexa are unremarkable.

Other: No ascites, pneumoperitoneum or focal collection.

Musculoskeletal: No acute abnormality or suspicious bony lesions.
IMPRESSION: 1. Equivocal mild circumferential wall thickening of the descending
and sigmoid colon. This may represent a mild colitis or simply under
distention of the colon. Correlate clinically. No bowel obstruction,
ascites, abscess or pneumoperitoneum.
2.  Aortic Atherosclerosis ([id number].G).
